# Patient Record
Sex: Male | Born: 2000 | Race: Black or African American | Hispanic: No | Marital: Single | Smoking: Never smoker
Health system: Southern US, Community
[De-identification: ages and names within clinical notes are randomized; demographics above are authoritative.]

## PROBLEM LIST (undated history)

## (undated) HISTORY — PX: APPENDECTOMY: SHX54

---
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
## 2019-08-28 ENCOUNTER — Emergency Department (HOSPITAL_COMMUNITY)
Admission: EM | Admit: 2019-08-28 | Discharge: 2019-08-28 | Disposition: A | Discharge status: Home / Self Care | Payer: BC Managed Care – PPO | Attending: Emergency Medicine | Admitting: Emergency Medicine

## 2019-08-28 ENCOUNTER — Other Ambulatory Visit: Payer: Self-pay

## 2019-08-28 ENCOUNTER — Ambulatory Visit (HOSPITAL_COMMUNITY)
Admission: EM | Admit: 2019-08-28 | Discharge: 2019-08-28 | Disposition: A | Discharge status: Home / Self Care | Payer: BLUE CROSS/BLUE SHIELD

## 2019-08-28 ENCOUNTER — Encounter (HOSPITAL_COMMUNITY): Payer: Self-pay | Admitting: Emergency Medicine

## 2019-08-28 ENCOUNTER — Emergency Department (HOSPITAL_COMMUNITY): Payer: BC Managed Care – PPO

## 2019-08-28 DIAGNOSIS — S0512XA Contusion of eyeball and orbital tissues, left eye, initial encounter: Secondary | ICD-10-CM | POA: Diagnosis not present

## 2019-08-28 DIAGNOSIS — Z23 Encounter for immunization: Secondary | ICD-10-CM | POA: Diagnosis not present

## 2019-08-28 DIAGNOSIS — Y999 Unspecified external cause status: Secondary | ICD-10-CM | POA: Diagnosis not present

## 2019-08-28 DIAGNOSIS — Y93I9 Activity, other involving external motion: Secondary | ICD-10-CM | POA: Insufficient documentation

## 2019-08-28 DIAGNOSIS — T07XXXA Unspecified multiple injuries, initial encounter: Secondary | ICD-10-CM

## 2019-08-28 DIAGNOSIS — S0990XA Unspecified injury of head, initial encounter: Secondary | ICD-10-CM | POA: Diagnosis present

## 2019-08-28 DIAGNOSIS — S40812A Abrasion of left upper arm, initial encounter: Secondary | ICD-10-CM | POA: Insufficient documentation

## 2019-08-28 DIAGNOSIS — Y929 Unspecified place or not applicable: Secondary | ICD-10-CM | POA: Insufficient documentation

## 2019-08-28 DIAGNOSIS — T1490XA Injury, unspecified, initial encounter: Secondary | ICD-10-CM

## 2019-08-28 MED ORDER — HYDROCODONE-ACETAMINOPHEN 5-325 MG PO TABS: 1.0000 | ORAL_TABLET | Freq: Four times a day (QID) | ORAL | 0 refills | Status: DC | PRN

## 2019-08-28 MED ORDER — TETANUS-DIPHTH-ACELL PERTUSSIS 5-2.5-18.5 LF-MCG/0.5 IM SUSP
0.5000 mL | Freq: Once | INTRAMUSCULAR | Status: AC
  Administered 2019-08-28: 0.5 mL via INTRAMUSCULAR
  Filled 2019-08-28: qty 0.5

## 2019-08-28 MED ORDER — HYDROCODONE-ACETAMINOPHEN 5-325 MG PO TABS
1.0000 | ORAL_TABLET | Freq: Once | ORAL | Status: AC
  Administered 2019-08-28: 1 via ORAL
  Filled 2019-08-28: qty 1
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
## 2019-08-28 NOTE — ED Notes (Signed)
C-collar place on patient in triage.

## 2019-08-28 NOTE — Discharge Instructions (Signed)
Please return for any problem.   Follow up with your regular care providers as instructed.  

## 2019-09-08 ENCOUNTER — Other Ambulatory Visit: Payer: Self-pay

## 2019-09-08 ENCOUNTER — Emergency Department (HOSPITAL_COMMUNITY)
Admission: EM | Admit: 2019-09-08 | Discharge: 2019-09-08 | Disposition: A | Discharge status: Home / Self Care | Payer: BC Managed Care – PPO | Attending: Emergency Medicine | Admitting: Emergency Medicine

## 2019-09-08 ENCOUNTER — Emergency Department (HOSPITAL_COMMUNITY): Payer: BC Managed Care – PPO

## 2019-09-08 DIAGNOSIS — M79601 Pain in right arm: Secondary | ICD-10-CM | POA: Insufficient documentation

## 2019-09-08 DIAGNOSIS — Z79899 Other long term (current) drug therapy: Secondary | ICD-10-CM | POA: Insufficient documentation

## 2019-09-08 DIAGNOSIS — M79602 Pain in left arm: Secondary | ICD-10-CM | POA: Diagnosis present

## 2019-09-08 MED ORDER — NAPROXEN 500 MG PO TABS: 500.0000 mg | ORAL_TABLET | Freq: Two times a day (BID) | ORAL | 0 refills | Status: AC

## 2019-09-08 MED ORDER — NAPROXEN 250 MG PO TABS
500.0000 mg | ORAL_TABLET | Freq: Once | ORAL | Status: AC
  Administered 2019-09-08: 500 mg via ORAL
  Filled 2019-09-08: qty 2

## 2019-09-08 NOTE — ED Notes (Signed)
Patient transported to X-ray 
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
## 2019-09-08 NOTE — Discharge Instructions (Addendum)
I have prescribed a short course of anti-inflammatories to help with your pain, please take 1 tablet twice a day to help with your symptoms.  Follow-up with your primary care physician as needed.  We discussed the need to have an x-ray repeated within 1 week in order to not miss any occult fractures.
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
## 2019-09-12 ENCOUNTER — Telehealth: Payer: Self-pay | Admitting: *Deleted

## 2019-09-12 NOTE — Telephone Encounter (Signed)
Pharmacy called related to pt being given incorrect Rx at discharge.  EDCM gave verbal order for Rx written by Janeece Fitting.

## 2019-10-30 ENCOUNTER — Ambulatory Visit (HOSPITAL_COMMUNITY)
Admission: EM | Admit: 2019-10-30 | Discharge: 2019-10-30 | Disposition: A | Discharge status: Home / Self Care | Payer: BC Managed Care – PPO | Attending: Internal Medicine | Admitting: Internal Medicine

## 2019-10-30 ENCOUNTER — Other Ambulatory Visit: Payer: Self-pay

## 2019-10-30 ENCOUNTER — Encounter (HOSPITAL_COMMUNITY): Payer: Self-pay

## 2019-10-30 DIAGNOSIS — R369 Urethral discharge, unspecified: Secondary | ICD-10-CM

## 2019-10-30 DIAGNOSIS — Z202 Contact with and (suspected) exposure to infections with a predominantly sexual mode of transmission: Secondary | ICD-10-CM

## 2019-10-30 LAB — HIV ANTIBODY (ROUTINE TESTING W REFLEX): HIV Screen 4th Generation wRfx: NONREACTIVE

## 2019-10-30 MED ORDER — AZITHROMYCIN 250 MG PO TABS
ORAL_TABLET | ORAL | Status: AC
  Filled 2019-10-30: qty 4

## 2019-10-30 MED ORDER — AZITHROMYCIN 250 MG PO TABS
1000.0000 mg | ORAL_TABLET | Freq: Once | ORAL | Status: AC
  Administered 2019-10-30: 1000 mg via ORAL

## 2019-10-30 MED ORDER — CEFTRIAXONE SODIUM 250 MG IJ SOLR
INTRAMUSCULAR | Status: AC
  Filled 2019-10-30: qty 250

## 2019-10-30 MED ORDER — CEFTRIAXONE SODIUM 250 MG IJ SOLR
250.0000 mg | Freq: Once | INTRAMUSCULAR | Status: AC
  Administered 2019-10-30: 250 mg via INTRAMUSCULAR
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
## 2019-10-30 NOTE — ED Provider Notes (Addendum)
[facility]    CSN: [id number] Arrival date & time: 10/30/19  1034      History   Chief Complaint Chief Complaint  Patient presents with  . Exposure to STD    HPI Bryce Santos is a 18 y.o. male with no past medical history comes to urgent care after he got to know that his sexual partner has tested positive for STD.  He was not sure what type of sexually transmitted infection.   He has penile discharge.  Denies any groin pain or testicular pain.  No fever or chills.  Patient was engaging in sexual intercourse over the past weekend with his sexual partner.  HPI  History reviewed. No pertinent past medical history.  There are no active problems to display for this patient.   Past Surgical History:  Procedure Laterality Date  . APPENDECTOMY    . LAPAROSCOPIC APPENDECTOMY  01/24/2012   Procedure: APPENDECTOMY LAPAROSCOPIC;  Surgeon: Judie Petit. Leonia Corona, MD;  Location: [facility];  Service: Pediatrics;  Laterality: N/A;       Home Medications    Prior to Admission medications   Medication Sig Start Date End Date Taking? Authorizing Provider  HYDROcodone-acetaminophen (NORCO/VICODIN) 5-325 MG tablet Take 1 tablet by mouth every 6 (six) hours as needed. 08/28/19   Wynetta Fines, MD    Family History Family History  Problem Relation Age of Onset  . Diabetes Maternal Grandmother   . Hypertension Maternal Grandmother     Social History Social History   Tobacco Use  . Smoking status: Never Smoker  . Smokeless tobacco: Never Used  Substance Use Topics  . Alcohol use: No  . Drug use: No     Allergies   Patient has no known allergies.   Review of Systems Review of Systems  Constitutional: Negative for activity change, chills, fatigue and fever.  Respiratory: Negative for cough, chest tightness and shortness of breath.   Gastrointestinal: Negative for abdominal distention, nausea and vomiting.  Genitourinary: Positive for discharge. Negative for dysuria,  frequency, genital sores, penile pain, penile swelling and urgency.  Musculoskeletal: Negative for arthralgias, joint swelling and neck stiffness.  Skin: Negative for rash and wound.  Neurological: Negative for dizziness, weakness and headaches.     Physical Exam Triage Vital Signs ED Triage Vitals  Enc Vitals Group     BP 10/30/19 1124 134/78     Pulse Rate 10/30/19 1124 75     Resp 10/30/19 1124 18     Temp 10/30/19 1124 98.6 F (37 C)     Temp Source 10/30/19 1124 Oral     SpO2 10/30/19 1124 99 %     Weight --      Height --      Head Circumference --      Peak Flow --      Pain Score 10/30/19 1125 0     Pain Loc --      Pain Edu? --      Excl. in GC? --    No data found.  Updated Vital Signs BP 134/78 (BP Location: Right Arm)   Pulse 75   Temp 98.6 F (37 C) (Oral)   Resp 18   SpO2 99%   Visual Acuity Right Eye Distance:   Left Eye Distance:   Bilateral Distance:    Right Eye Near:   Left Eye Near:    Bilateral Near:     Physical Exam Constitutional:      Appearance: He is not  ill-appearing or toxic-appearing.  Cardiovascular:     Rate and Rhythm: Normal rate and regular rhythm.     Pulses: Normal pulses.     Heart sounds: Normal heart sounds.  Pulmonary:     Effort: Pulmonary effort is normal.     Breath sounds: Normal breath sounds. No wheezing or rhonchi.  Abdominal:     General: Bowel sounds are normal.     Tenderness: There is no abdominal tenderness. There is no rebound.  Genitourinary:    Penis: Normal.      Scrotum/Testes: Normal.     Comments: Whitish penile discharge noted Musculoskeletal: Normal range of motion.        General: No swelling or tenderness.  Skin:    General: Skin is warm.     Capillary Refill: Capillary refill takes less than 2 seconds.  Neurological:     Mental Status: He is alert.      UC Treatments / Results  Labs (all labs ordered are listed, but only abnormal results are displayed) Labs Reviewed  HIV  ANTIBODY (ROUTINE TESTING W REFLEX)  RPR  CYTOLOGY, (ORAL, ANAL, URETHRAL) ANCILLARY ONLY    EKG   Radiology No results found.  Procedures Procedures (including critical care time)  Medications Ordered in UC Medications  cefTRIAXone (ROCEPHIN) injection 250 mg (250 mg Intramuscular Given 10/30/19 1151)  azithromycin (ZITHROMAX) tablet 1,000 mg (1,000 mg Oral Given 10/30/19 1151)  azithromycin (ZITHROMAX) 250 MG tablet (has no administration in time range)  cefTRIAXone (ROCEPHIN) 250 MG injection (has no administration in time range)    Initial Impression / Assessment and Plan / UC Course  I have reviewed the triage vital signs and the nursing notes.  Pertinent labs & imaging results that were available during my care of the patient were reviewed by me and considered in my medical decision making (see chart for details).     1.  Penile discharge: Azithromycin 1 g x 1 dose Ceftriaxone 250 mg IM x1 dose Urethral discharge for GC/chlamydia/trichomonas HIV/RPR If patient has worsening symptoms he is advised to return to urgent care to be reevaluated Patient is advised to abstain from sexual intercourse for 7 days. Final Clinical Impressions(s) / UC Diagnoses   Final diagnoses:  Penile discharge  STD exposure   Discharge Instructions   None    ED Prescriptions    None     PDMP not reviewed this encounter.   Chase Picket, MD 10/30/19 1836    Chase Picket, MD 10/30/19 [phone]

## 2019-10-31 LAB — CYTOLOGY, (ORAL, ANAL, URETHRAL) ANCILLARY ONLY
Chlamydia: NEGATIVE
Neisseria Gonorrhea: POSITIVE — AB
Trichomonas: NEGATIVE

## 2019-10-31 LAB — RPR: RPR Ser Ql: NONREACTIVE

## 2019-11-01 ENCOUNTER — Telehealth (HOSPITAL_COMMUNITY): Payer: Self-pay | Admitting: Emergency Medicine

## 2019-11-01 NOTE — Telephone Encounter (Signed)
Test for gonorrhea was positive. This was treated at the urgent care visit with IM rocephin 250mg and po zithromax 1g. Pt needs education to refrain from sexual intercourse for 7 days after treatment to give the medicine time to work. Sexual partners need to be notified and tested/treated. Condoms may reduce risk of reinfection. Recheck or followup with PCP for further evaluation if symptoms are not improving. [facility] notified.  Patient contacted by phone and made aware of    results. Pt verbalized understanding and had all questions answered.    

## 2020-02-01 IMAGING — CR DG FOREARM 2V*R*
2 series · 2 of 2 positions shown · non-contrast
Comparison: No prior.

CLINICAL DATA: Right arm injury.

EXAM:
RIGHT FOREARM - 2 VIEW

[forearm ap]
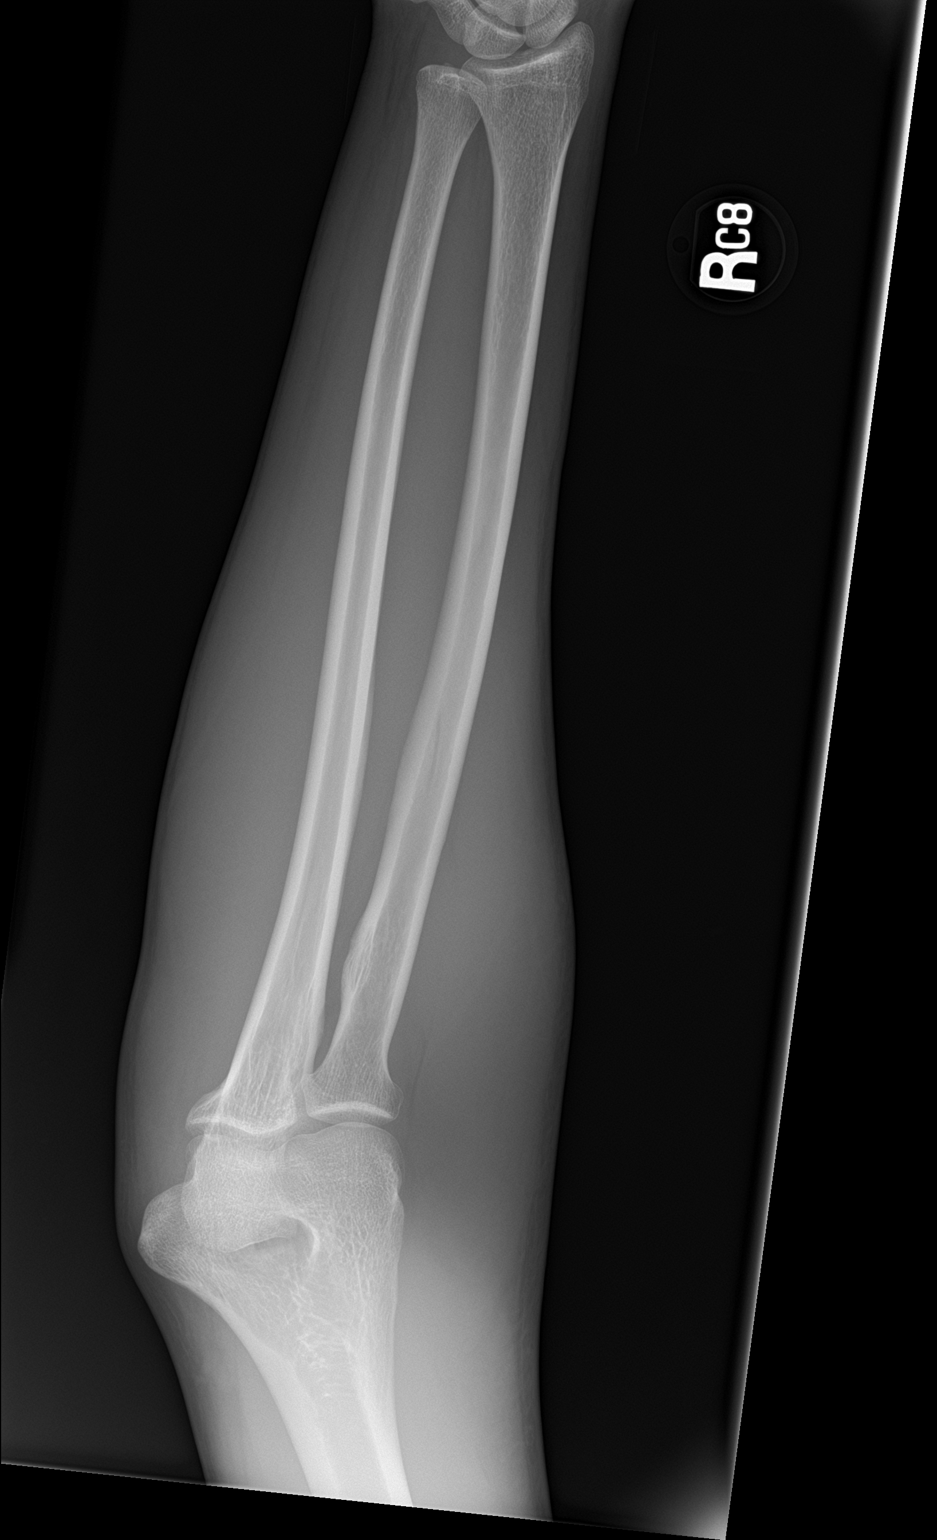

[forearm lat]
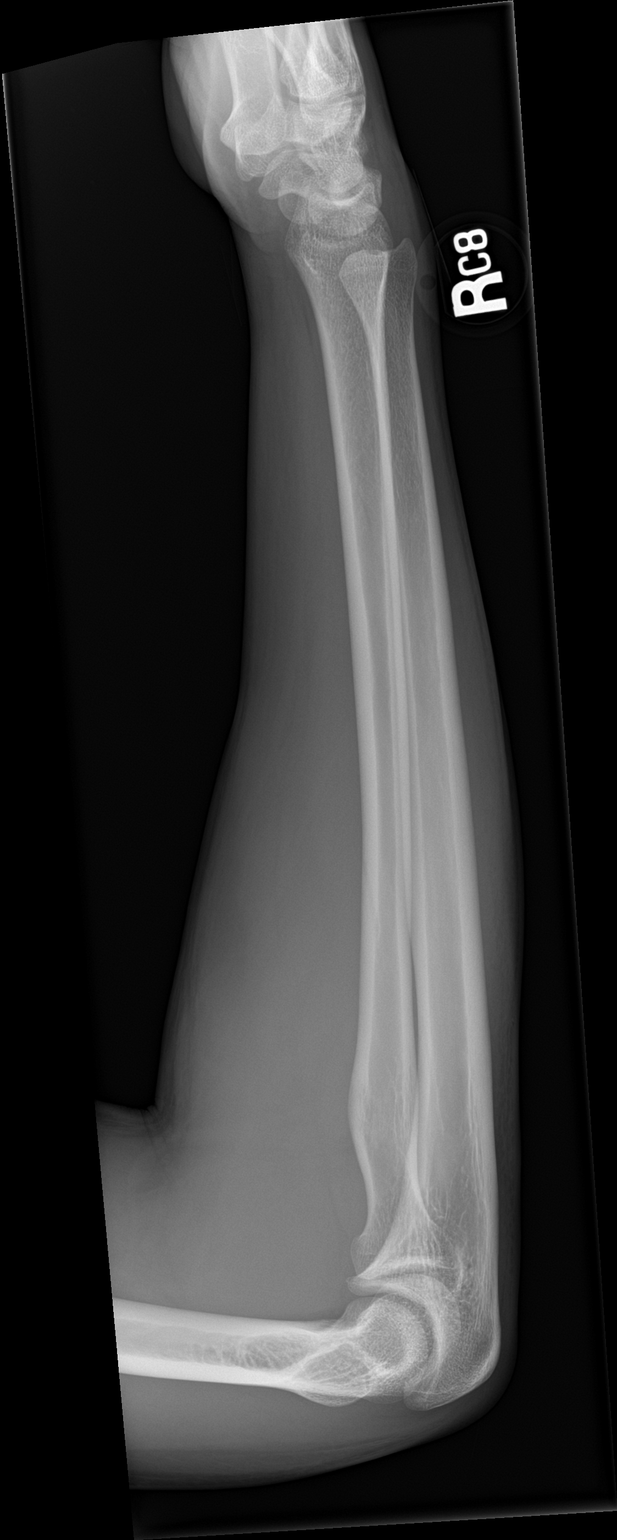

[2 of 2 positions shown; findings below may reference images not displayed]

FINDINGS: Linear lucency noted in the mid right radius seen only on AP view.
This most likely a vascular channel. To exclude a fracture follow-up
imaging in 7-10 days should be considered. No other abnormality
identified.
IMPRESSION: Pain lucency noted the mid radius seen only on AP view. This most
likely a vascular channel. To exclude fracture follow-up imaging in
7-10 days should be considered. No other abnormality identified.

## 2021-09-04 ENCOUNTER — Other Ambulatory Visit: Payer: Self-pay

## 2021-09-04 ENCOUNTER — Encounter (HOSPITAL_COMMUNITY): Payer: Self-pay | Admitting: *Deleted

## 2021-09-04 ENCOUNTER — Ambulatory Visit (HOSPITAL_COMMUNITY)
Admission: EM | Admit: 2021-09-04 | Discharge: 2021-09-04 | Disposition: A | Discharge status: Home / Self Care | Payer: BC Managed Care – PPO | Attending: Physician Assistant | Admitting: Physician Assistant

## 2021-09-04 DIAGNOSIS — Z711 Person with feared health complaint in whom no diagnosis is made: Secondary | ICD-10-CM | POA: Insufficient documentation

## 2021-09-04 DIAGNOSIS — R369 Urethral discharge, unspecified: Secondary | ICD-10-CM | POA: Insufficient documentation

## 2021-09-04 DIAGNOSIS — Z113 Encounter for screening for infections with a predominantly sexual mode of transmission: Secondary | ICD-10-CM | POA: Diagnosis not present

## 2021-09-04 DIAGNOSIS — R300 Dysuria: Secondary | ICD-10-CM | POA: Diagnosis not present

## 2021-09-04 LAB — HIV ANTIBODY (ROUTINE TESTING W REFLEX): HIV Screen 4th Generation wRfx: NONREACTIVE

## 2021-09-04 LAB — POCT URINALYSIS DIPSTICK, ED / UC
Bilirubin Urine: NEGATIVE
Glucose, UA: NEGATIVE mg/dL
Ketones, ur: NEGATIVE mg/dL
Nitrite: NEGATIVE
Protein, ur: NEGATIVE mg/dL
Specific Gravity, Urine: >=1.03 (ref 1.005–1.030)
Urobilinogen, UA: 1 mg/dL (ref 0.0–1.0)
pH: 6.5 (ref 5.0–8.0)

## 2021-09-04 LAB — HEPATITIS C ANTIBODY: HCV Ab: NONREACTIVE

## 2021-09-04 MED ORDER — LIDOCAINE HCL (PF) 1 % IJ SOLN
INTRAMUSCULAR | Status: AC
  Filled 2021-09-04: qty 30

## 2021-09-04 MED ORDER — CEFTRIAXONE SODIUM 500 MG IJ SOLR
500.0000 mg | Freq: Once | INTRAMUSCULAR | Status: AC
  Administered 2021-09-04: 500 mg via INTRAMUSCULAR

## 2021-09-04 MED ORDER — CEFTRIAXONE SODIUM 500 MG IJ SOLR
INTRAMUSCULAR | Status: AC
  Filled 2021-09-04: qty 500

## 2021-09-04 NOTE — ED Triage Notes (Signed)
Pt reports yesterday had dysuria and wants to be checked for STD.

## 2021-09-04 NOTE — Discharge Instructions (Addendum)
We are sending off your testing and we will contact you if you are positive for anything and we need to arrange treatment.  You were given the treatment for gonorrhea today.  If you have any worsening symptoms you need to return for reevaluation.  It is important that you abstain from sex until you get results.  All partners will need to be tested and treated.  It is important that you use a condom with each sexual encounter.

## 2021-09-04 NOTE — ED Provider Notes (Signed)
[facility]    CSN: [id number] Arrival date & time: 09/04/21  1150      History   Chief Complaint Chief Complaint  Patient presents with   SEXUALLY TRANSMITTED DISEASE   Dysuria    HPI Bryce Santos is a 20 y.o. male.   Patient presents today with a 2-day history of dysuria.  Reports associated penile discharge.  He describes as thick and yellow.  He denies any additional symptoms including hematuria, abdominal pain, fever, nausea, vomiting, pelvic pain, genital sore.  He denies any known exposure but is interested in STI testing including testing for HIV/hepatitis/syphilis.  He is sexually active with male partners but does not consistently use condoms.  He has had STIs in the past but reports complete resolution of symptoms after completing treatment.  He denies any recent antibiotic use.  He is interested in treatment if appropriate today.   History reviewed. No pertinent past medical history.  There are no problems to display for this patient.   Past Surgical History:  Procedure Laterality Date   APPENDECTOMY     LAPAROSCOPIC APPENDECTOMY  01/24/2012   Procedure: APPENDECTOMY LAPAROSCOPIC;  Surgeon: Judie Petit. Leonia Corona, MD;  Location: [facility];  Service: Pediatrics;  Laterality: N/A;       Home Medications    Prior to Admission medications   Not on File    Family History Family History  Problem Relation Age of Onset   Diabetes Maternal Grandmother    Hypertension Maternal Grandmother     Social History Social History   Tobacco Use   Smoking status: Never   Smokeless tobacco: Never  Substance Use Topics   Alcohol use: No   Drug use: No     Allergies   Patient has no known allergies.   Review of Systems Review of Systems  Constitutional:  Negative for activity change, appetite change, fatigue and fever.  Respiratory:  Negative for cough and shortness of breath.   Cardiovascular:  Negative for chest pain.  Gastrointestinal:  Negative for  abdominal pain, diarrhea, nausea and vomiting.  Genitourinary:  Positive for dysuria and penile discharge. Negative for frequency, genital sores, penile pain, penile swelling and urgency.  Musculoskeletal:  Negative for arthralgias, back pain and myalgias.  Neurological:  Negative for dizziness, light-headedness and headaches.    Physical Exam Triage Vital Signs ED Triage Vitals  Enc Vitals Group     BP 09/04/21 1326 120/79     Pulse Rate 09/04/21 1326 62     Resp 09/04/21 1326 18     Temp 09/04/21 1326 98.1 F (36.7 C)     Temp src --      SpO2 09/04/21 1326 99 %     Weight --      Height --      Head Circumference --      Peak Flow --      Pain Score 09/04/21 1324 0     Pain Loc --      Pain Edu? --      Excl. in GC? --    No data found.  Updated Vital Signs BP 120/79   Pulse 62   Temp 98.1 F (36.7 C)   Resp 18   SpO2 99%   Visual Acuity Right Eye Distance:   Left Eye Distance:   Bilateral Distance:    Right Eye Near:   Left Eye Near:    Bilateral Near:     Physical Exam Vitals reviewed.  Constitutional:  General: He is awake.     Appearance: Normal appearance. He is well-developed. He is not ill-appearing.     Comments: Very pleasant male appears at age no acute distress sitting comfortably in exam room  HENT:     Head: Normocephalic and atraumatic.  Cardiovascular:     Rate and Rhythm: Normal rate and regular rhythm.     Heart sounds: Normal heart sounds, S1 normal and S2 normal. No murmur heard. Pulmonary:     Effort: Pulmonary effort is normal.     Breath sounds: Normal breath sounds. No stridor. No wheezing, rhonchi or rales.     Comments: Clear to auscultation bilaterally Abdominal:     General: Bowel sounds are normal.     Palpations: Abdomen is soft.     Tenderness: There is no abdominal tenderness. There is no right CVA tenderness, left CVA tenderness, guarding or rebound.     Comments: Benign abdominal exam  Genitourinary:     Comments: Exam deferred Neurological:     Mental Status: He is alert.  Psychiatric:        Behavior: Behavior is cooperative.     UC Treatments / Results  Labs (all labs ordered are listed, but only abnormal results are displayed) Labs Reviewed  POCT URINALYSIS DIPSTICK, ED / UC - Abnormal; Notable for the following components:      Result Value   Hgb urine dipstick TRACE (*)    Leukocytes,Ua SMALL (*)    All other components within normal limits  URINE CULTURE  HIV ANTIBODY (ROUTINE TESTING W REFLEX)  HEPATITIS C ANTIBODY  RPR  CYTOLOGY, (ORAL, ANAL, URETHRAL) ANCILLARY ONLY    EKG   Radiology No results found.  Procedures Procedures (including critical care time)  Medications Ordered in UC Medications  cefTRIAXone (ROCEPHIN) injection 500 mg (has no administration in time range)    Initial Impression / Assessment and Plan / UC Course  I have reviewed the triage vital signs and the nursing notes.  Pertinent labs & imaging results that were available during my care of the patient were reviewed by me and considered in my medical decision making (see chart for details).      Concern for STI given penile discharge.  UA was obtained today given associated dysuria which showed hemoglobin and leukocyte esterase.  Suspect this is related to urethritis from STI's we will defer additional antibiotic treatment until urine culture results are obtained.  STI panel was obtained-results pending.  Patient empirically treated for gonorrhea but will defer additional treatment until swab results are obtained.  Discussed the importance of safe sex practices.  Discussed that he needs to abstain from sex for 7 days from treatment and until he receives all results.  All partners will need to be tested and treated as well.  Discussed alarm symptoms that warrant emergent evaluation.  Strict return precautions given to which he expressed understanding.  Final Clinical Impressions(s) / UC  Diagnoses   Final diagnoses:  Dysuria  Penile discharge  Concern about STD in male without diagnosis  Routine screening for STI (sexually transmitted infection)     Discharge Instructions      We are sending off your testing and we will contact you if you are positive for anything and we need to arrange treatment.  You were given the treatment for gonorrhea today.  If you have any worsening symptoms you need to return for reevaluation.  It is important that you abstain from sex until you get results.  All partners will need to be tested and treated.  It is important that you use a condom with each sexual encounter.      ED Prescriptions   None    PDMP not reviewed this encounter.   Jeani Hawking, PA-C 09/04/21 1410

## 2021-09-05 LAB — RPR: RPR Ser Ql: NONREACTIVE

## 2021-09-05 LAB — URINE CULTURE: Culture: NO GROWTH

## 2021-09-08 ENCOUNTER — Telehealth (HOSPITAL_COMMUNITY): Payer: Self-pay | Admitting: Emergency Medicine

## 2021-09-08 LAB — CYTOLOGY, (ORAL, ANAL, URETHRAL) ANCILLARY ONLY
Chlamydia: POSITIVE — AB
Comment: NEGATIVE
Comment: NEGATIVE
Comment: NORMAL
Neisseria Gonorrhea: POSITIVE — AB
Trichomonas: POSITIVE — AB

## 2021-09-08 MED ORDER — METRONIDAZOLE 500 MG PO TABS: 2000.0000 mg | ORAL_TABLET | Freq: Once | ORAL | 0 refills | Status: AC

## 2021-09-08 MED ORDER — DOXYCYCLINE HYCLATE 100 MG PO CAPS: 100.0000 mg | ORAL_CAPSULE | Freq: Two times a day (BID) | ORAL | 0 refills | Status: AC

## 2021-11-04 ENCOUNTER — Ambulatory Visit (HOSPITAL_COMMUNITY): Payer: BC Managed Care – PPO

## 2021-11-22 ENCOUNTER — Other Ambulatory Visit: Payer: Self-pay

## 2021-11-22 ENCOUNTER — Emergency Department (INDEPENDENT_AMBULATORY_CARE_PROVIDER_SITE_OTHER)
Admission: EM | Admit: 2021-11-22 | Discharge: 2021-11-22 | Disposition: A | Discharge status: Home / Self Care | Payer: BC Managed Care – PPO | Source: Home / Self Care

## 2021-11-22 ENCOUNTER — Other Ambulatory Visit (HOSPITAL_COMMUNITY)
Admission: RE | Admit: 2021-11-22 | Discharge: 2021-11-22 | Disposition: A | Discharge status: Home / Self Care | Payer: BC Managed Care – PPO | Source: Ambulatory Visit | Attending: Family Medicine | Admitting: Family Medicine

## 2021-11-22 DIAGNOSIS — Z202 Contact with and (suspected) exposure to infections with a predominantly sexual mode of transmission: Secondary | ICD-10-CM | POA: Insufficient documentation

## 2021-11-22 NOTE — ED Provider Notes (Signed)
[facility]    CSN: [id number] Arrival date & time: 11/22/21  4098      History   Chief Complaint Chief Complaint  Patient presents with   penile irritation    Penile irritation x2 days    HPI Bryce Santos is a 20 y.o. male.   HPI 20 year old male presents with possible STD exposure for 2 days.  He reports some mild penile irritation.  Denies penile discharge or dysuria.  Reports history of STD (GC chlamydia on 10/30/2019).  History reviewed. No pertinent past medical history.  There are no problems to display for this patient.   Past Surgical History:  Procedure Laterality Date   APPENDECTOMY     LAPAROSCOPIC APPENDECTOMY  01/24/2012   Procedure: APPENDECTOMY LAPAROSCOPIC;  Surgeon: Judie Petit. Leonia Corona, MD;  Location: [facility];  Service: Pediatrics;  Laterality: N/A;       Home Medications    Prior to Admission medications   Not on File    Family History Family History  Problem Relation Age of Onset   Diabetes Maternal Grandmother    Hypertension Maternal Grandmother     Social History Social History   Tobacco Use   Smoking status: Never   Smokeless tobacco: Never  Vaping Use   Vaping Use: Every day  Substance Use Topics   Alcohol use: Yes   Drug use: No     Allergies   Patient has no known allergies.   Review of Systems Review of Systems  Genitourinary:        Penile irritation x2 days  All other systems reviewed and are negative.   Physical Exam Triage Vital Signs ED Triage Vitals  Enc Vitals Group     BP      Pulse      Resp      Temp      Temp src      SpO2      Weight      Height      Head Circumference      Peak Flow      Pain Score      Pain Loc      Pain Edu?      Excl. in GC?    No data found.  Updated Vital Signs BP 139/83 (BP Location: Right Arm)    Pulse 66    Temp 98.8 F (37.1 C) (Oral)    Resp 20    Ht 5\' 8"  (1.727 m)    Wt 140 lb (63.5 kg)    SpO2 99%    BMI 21.29 kg/m       Physical  Exam Vitals and nursing note reviewed.  Constitutional:      Appearance: Normal appearance. He is normal weight.  HENT:     Head: Normocephalic and atraumatic.     Mouth/Throat:     Mouth: Mucous membranes are moist.     Pharynx: Oropharynx is clear.  Eyes:     Extraocular Movements: Extraocular movements intact.     Conjunctiva/sclera: Conjunctivae normal.     Pupils: Pupils are equal, round, and reactive to light.  Cardiovascular:     Rate and Rhythm: Normal rate and regular rhythm.     Pulses: Normal pulses.     Heart sounds: Normal heart sounds.  Pulmonary:     Effort: Pulmonary effort is normal.     Breath sounds: Normal breath sounds.  Musculoskeletal:     Cervical back: Normal range of motion and neck supple.  Skin:    General: Skin is warm and dry.  Neurological:     General: No focal deficit present.     Mental Status: He is alert and oriented to person, place, and time.     UC Treatments / Results  Labs (all labs ordered are listed, but only abnormal results are displayed) Labs Reviewed - No data to display  EKG   Radiology No results found.  Procedures Procedures (including critical care time)  Medications Ordered in UC Medications - No data to display  Initial Impression / Assessment and Plan / UC Course  I have reviewed the triage vital signs and the nursing notes.  Pertinent labs & imaging results that were available during my care of the patient were reviewed by me and considered in my medical decision making (see chart for details).     MDM: 1.  Possible exposure to STD-Aptima swab ordered, advised patient we will follow-up with Aptima swab results once received.  Patient discharged home, hemodynamically stable. Final Clinical Impressions(s) / UC Diagnoses   Final diagnoses:  Possible exposure to STD     Discharge Instructions      Advised patient we will follow-up with Aptima swab results once received.     ED Prescriptions    None    PDMP not reviewed this encounter.   Trevor Iha, FNP 11/22/21 [phone]

## 2021-11-22 NOTE — Discharge Instructions (Addendum)
Advised patient we will follow-up with Aptima swab results once received. 

## 2021-11-22 NOTE — ED Triage Notes (Signed)
Pt states that he has some penile irritation x2 days

## 2021-11-25 LAB — CYTOLOGY, (ORAL, ANAL, URETHRAL) ANCILLARY ONLY
Chlamydia: NEGATIVE
Comment: NEGATIVE
Comment: NEGATIVE
Comment: NORMAL
Neisseria Gonorrhea: NEGATIVE
Trichomonas: NEGATIVE
